# Patient Record
Sex: Female | Born: 1980 | Race: White | Hispanic: No | Marital: Married | Smoking: Never smoker
Health system: Southern US, Community
[De-identification: ages and names within clinical notes are randomized; demographics above are authoritative.]

## PROBLEM LIST (undated history)

## (undated) DIAGNOSIS — F32A Depression, unspecified: Secondary | ICD-10-CM

## (undated) DIAGNOSIS — F329 Major depressive disorder, single episode, unspecified: Secondary | ICD-10-CM

## (undated) HISTORY — PX: APPENDECTOMY: SHX54

---

## 2014-02-16 ENCOUNTER — Encounter (HOSPITAL_BASED_OUTPATIENT_CLINIC_OR_DEPARTMENT_OTHER): Payer: Self-pay

## 2014-02-16 ENCOUNTER — Emergency Department (HOSPITAL_BASED_OUTPATIENT_CLINIC_OR_DEPARTMENT_OTHER): Payer: 59

## 2014-02-16 ENCOUNTER — Emergency Department (HOSPITAL_BASED_OUTPATIENT_CLINIC_OR_DEPARTMENT_OTHER)
Admission: EM | Admit: 2014-02-16 | Discharge: 2014-02-16 | Disposition: A | Discharge status: Home / Self Care | Payer: 59 | Attending: Emergency Medicine | Admitting: Emergency Medicine

## 2014-02-16 DIAGNOSIS — R1011 Right upper quadrant pain: Secondary | ICD-10-CM | POA: Diagnosis not present

## 2014-02-16 DIAGNOSIS — R55 Syncope and collapse: Secondary | ICD-10-CM | POA: Diagnosis not present

## 2014-02-16 DIAGNOSIS — R61 Generalized hyperhidrosis: Secondary | ICD-10-CM | POA: Diagnosis not present

## 2014-02-16 DIAGNOSIS — F329 Major depressive disorder, single episode, unspecified: Secondary | ICD-10-CM | POA: Insufficient documentation

## 2014-02-16 DIAGNOSIS — R0789 Other chest pain: Secondary | ICD-10-CM | POA: Diagnosis not present

## 2014-02-16 DIAGNOSIS — R079 Chest pain, unspecified: Secondary | ICD-10-CM | POA: Diagnosis present

## 2014-02-16 DIAGNOSIS — R1013 Epigastric pain: Secondary | ICD-10-CM | POA: Insufficient documentation

## 2014-02-16 DIAGNOSIS — Z79899 Other long term (current) drug therapy: Secondary | ICD-10-CM | POA: Insufficient documentation

## 2014-02-16 HISTORY — DX: Major depressive disorder, single episode, unspecified: F32.9

## 2014-02-16 HISTORY — DX: Depression, unspecified: F32.A

## 2014-02-16 LAB — COMPREHENSIVE METABOLIC PANEL
ALT: 16 U/L (ref 0–35)
AST: 18 U/L (ref 0–37)
Albumin: 4.6 g/dL (ref 3.5–5.2)
Alkaline Phosphatase: 39 U/L (ref 39–117)
Anion gap: 5 (ref 5–15)
BUN: 18 mg/dL (ref 6–23)
CO2: 26 mmol/L (ref 19–32)
Calcium: 9 mg/dL (ref 8.4–10.5)
Chloride: 104 mEq/L (ref 96–112)
Creatinine, Ser: 0.65 mg/dL (ref 0.50–1.10)
GFR calc Af Amer: >90 mL/min (ref >90)
GLUCOSE: 104 mg/dL — AB (ref 70–99)
POTASSIUM: 3.8 mmol/L (ref 3.5–5.1)
SODIUM: 135 mmol/L (ref 135–145)
Total Bilirubin: 0.3 mg/dL (ref 0.3–1.2)
Total Protein: 7.4 g/dL (ref 6.0–8.3)

## 2014-02-16 LAB — CBC WITH DIFFERENTIAL/PLATELET
BASOS PCT: 0 % (ref 0–1)
Basophils Absolute: 0 10*3/uL (ref 0.0–0.1)
EOS PCT: 2 % (ref 0–5)
Eosinophils Absolute: 0.1 10*3/uL (ref 0.0–0.7)
HEMATOCRIT: 39.7 % (ref 36.0–46.0)
Hemoglobin: 13.1 g/dL (ref 12.0–15.0)
LYMPHS ABS: 1.6 10*3/uL (ref 0.7–4.0)
LYMPHS PCT: 34 % (ref 12–46)
MCH: 29.8 pg (ref 26.0–34.0)
MCHC: 33 g/dL (ref 30.0–36.0)
MCV: 90.4 fL (ref 78.0–100.0)
MONO ABS: 0.3 10*3/uL (ref 0.1–1.0)
Monocytes Relative: 6 % (ref 3–12)
Neutro Abs: 2.7 10*3/uL (ref 1.7–7.7)
Neutrophils Relative %: 58 % (ref 43–77)
Platelets: 264 10*3/uL (ref 150–400)
RBC: 4.39 MIL/uL (ref 3.87–5.11)
RDW: 12.7 % (ref 11.5–15.5)
WBC: 4.7 10*3/uL (ref 4.0–10.5)

## 2014-02-16 LAB — TROPONIN I

## 2014-02-16 LAB — D-DIMER, QUANTITATIVE: D-Dimer, Quant: <0.27 ug/mL-FEU (ref 0.00–0.48)

## 2014-02-16 MED ORDER — NAPROXEN 500 MG PO TABS: 500.0000 mg | ORAL_TABLET | Freq: Two times a day (BID) | ORAL | Status: AC

## 2014-02-16 MED ORDER — KETOROLAC TROMETHAMINE 15 MG/ML IJ SOLN
15.0000 mg | Freq: Once | INTRAMUSCULAR | Status: AC
  Administered 2014-02-16: 15 mg via INTRAVENOUS
  Filled 2014-02-16: qty 1

## 2014-02-16 MED ORDER — ONDANSETRON HCL 4 MG/2ML IJ SOLN
4.0000 mg | Freq: Once | INTRAMUSCULAR | Status: AC
  Administered 2014-02-16: 4 mg via INTRAVENOUS
  Filled 2014-02-16: qty 2

## 2014-02-16 MED ORDER — FENTANYL CITRATE 0.05 MG/ML IJ SOLN
50.0000 ug | Freq: Once | INTRAMUSCULAR | Status: AC
  Administered 2014-02-16: 50 ug via INTRAVENOUS
  Filled 2014-02-16: qty 2

## 2014-02-16 MED ORDER — IOHEXOL 350 MG/ML SOLN
100.0000 mL | Freq: Once | INTRAVENOUS | Status: AC | PRN
  Administered 2014-02-16: 100 mL via INTRAVENOUS

## 2014-02-16 NOTE — ED Provider Notes (Addendum)
CSN: [id number]     Arrival date & time 02/16/14  0700 History   First MD Initiated Contact with Patient 02/16/14 0725     Chief Complaint  Patient presents with  . Chest Pain     (Consider location/radiation/quality/duration/timing/severity/associated sxs/prior Treatment) Patient is a 34 y.o. female presenting with chest pain. The history is provided by the patient and the spouse.  Chest Pain Pain location:  L chest Pain quality: sharp and stabbing   Pain radiates to:  L shoulder and L arm Pain radiates to the back: yes   Pain severity:  Severe Onset quality:  Sudden Duration:  1 hour Timing:  Constant Progression:  Unchanged Chronicity:  New Context comment:  States she woke up this morning to her alarm and was fine and then when she went to get out of bed she developed severe pain in her left chest and shoulder area Relieved by:  Nothing Worsened by:  Movement (Movement of the arm and attempts to getting up) Ineffective treatments:  Rest Associated symptoms: diaphoresis and near-syncope   Associated symptoms: no cough, no dizziness, no fever, no headache, no lower extremity edema, no nausea, no numbness, no palpitations, no shortness of breath, not vomiting and no weakness   Risk factors: immobilization   Risk factors: no birth control, no coronary artery disease, no diabetes mellitus, no high cholesterol, no hypertension, no Marfan's syndrome, not obese, not pregnant, no prior DVT/PE, no smoking and no surgery   Risk factors comment:  Patient recently had a long car trip from [place] to [facility] Thursday and Sunday   Past Medical History  Diagnosis Date  . Depression    Past Surgical History  Procedure Laterality Date  . Appendectomy     No family history on file. History  Substance Use Topics  . Smoking status: Never Smoker   . Smokeless tobacco: Not on file  . Alcohol Use: Yes     Comment: occasional   OB History    No data available     Review of  Systems  Constitutional: Positive for diaphoresis. Negative for fever.  Respiratory: Negative for cough and shortness of breath.   Cardiovascular: Positive for chest pain and near-syncope. Negative for palpitations.  Gastrointestinal: Negative for nausea and vomiting.  Neurological: Negative for dizziness, weakness, numbness and headaches.  All other systems reviewed and are negative.     Allergies  Review of patient's allergies indicates no known allergies.  Home Medications   Prior to Admission medications   Medication Sig Start Date End Date Taking? Authorizing Provider  FLUoxetine (PROZAC) 20 MG capsule Take 20 mg by mouth daily.   Yes Historical Provider, MD   BP 128/80 mmHg  Pulse 78  Temp(Src) 98.4 F (36.9 C) (Oral)  Resp 16  Ht  (1.651 m)  Wt 104 lb (47.174 kg)  BMI 17.31 kg/m2  SpO2 100%  LMP 02/16/2014 Physical Exam  Constitutional: She is oriented to person, place, and time. She appears well-developed and well-nourished.  Thin female lying still in bed with mild diaphoresis  HENT:  Head: Normocephalic and atraumatic.  Mouth/Throat: Oropharynx is clear and moist.  Eyes: Conjunctivae and EOM are normal. Pupils are equal, round, and reactive to light.  Neck: Normal range of motion. Neck supple.  Cardiovascular: Normal rate, regular rhythm and intact distal pulses.   No murmur heard. To palpation left radial pulse decreased compared to right  Pulmonary/Chest: Effort normal and breath sounds normal. No respiratory distress. She has no wheezes.  She has no rales. She exhibits no tenderness.  Abdominal: Soft. She exhibits no distension. There is tenderness in the right upper quadrant and epigastric area. There is no rebound and no guarding.    Mild epigastric and right upper quadrant tenderness  Musculoskeletal: Normal range of motion. She exhibits no edema or tenderness.  Neurological: She is alert and oriented to person, place, and time.  Skin: Skin is warm.  No rash noted. She is diaphoretic. No erythema. There is pallor.  Psychiatric: She has a normal mood and affect. Her behavior is normal.  Nursing note and vitals reviewed.   ED Course  Procedures (including critical care time) Labs Review Labs Reviewed  COMPREHENSIVE METABOLIC PANEL - Abnormal; Notable for the following:    Glucose, Bld 104 (*)    All other components within normal limits  CBC WITH DIFFERENTIAL  TROPONIN I  D-DIMER, QUANTITATIVE    Imaging Review Dg Chest 2 View  02/16/2014   CLINICAL DATA:  Left shoulder pain  EXAM: CHEST  2 VIEW  COMPARISON:  None.  FINDINGS: Lungs are clear.  No pleural effusion or pneumothorax.  The heart is normal in size.  Visualized osseous structures are within normal limits.  IMPRESSION: Normal chest radiographs.   Electronically Signed   By: Charline Bills M.D.   On: 02/16/2014 07:51   Ct Angio Chest Aorta W/cm &/or Wo/cm  02/16/2014   CLINICAL DATA:  Left shoulder pain radiating into the chest. Dizziness and lightheadedness. Question aortic dissection. Initial encounter.  EXAM: CT ANGIOGRAPHY CHEST, ABDOMEN AND PELVIS  TECHNIQUE: Multidetector CT imaging through the chest, abdomen and pelvis was performed using the standard protocol during bolus administration of intravenous contrast. Multiplanar reconstructed images and MIPs were obtained and reviewed to evaluate the vascular anatomy.  CONTRAST:  100 mL OMNIPAQUE IOHEXOL 350 MG/ML SOLN  COMPARISON:  None.  FINDINGS: CTA CHEST FINDINGS  There is no aortic dissection or aneurysm. Heart size is normal. Small calcified lymph nodes are seen in the hila. No pathologically enlarged lymph nodes are identified in the axilla, mediastinum or hila. There is no pleural or pericardial effusion. Heart size is normal. Calcified granulomata are seen in the right lower lobe. The lungs are otherwise clear. There is no focal bony abnormality.  Review of the MIP images confirms the above findings.  CTA ABDOMEN AND  PELVIS FINDINGS  There is no aortic dissection or aneurysm. No atherosclerotic vascular disease is identified. All major branch vessels of the aorta are widely patent.  The liver, spleen, adrenal glands, pancreas and kidneys all appear normal. Uterus, adnexa and urinary bladder are unremarkable. The stomach and small and large bowel appear normal. The patient is status post appendectomy. There is no lymphadenopathy or fluid. No bony abnormality is identified.  Review of the MIP images confirms the above findings.  IMPRESSION: Negative for aortic dissection in the chest, abdomen or pelvis. No acute abnormality or finding to explain the patient's symptoms is identified.  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   Electronically Signed   By: Drusilla Kanner M.D.   On: 02/16/2014 08:44   Ct Cta Abd/pel W/cm &/or W/o Cm  02/16/2014   CLINICAL DATA:  Left shoulder pain radiating into the chest. Dizziness and lightheadedness. Question aortic dissection. Initial encounter.  EXAM: CT ANGIOGRAPHY CHEST, ABDOMEN AND PELVIS  TECHNIQUE: Multidetector CT imaging through the chest, abdomen and pelvis was performed using the standard protocol during bolus administration of intravenous contrast. Multiplanar reconstructed images  and MIPs were obtained and reviewed to evaluate the vascular anatomy.  CONTRAST:  100 mL OMNIPAQUE IOHEXOL 350 MG/ML SOLN  COMPARISON:  None.  FINDINGS: CTA CHEST FINDINGS  There is no aortic dissection or aneurysm. Heart size is normal. Small calcified lymph nodes are seen in the hila. No pathologically enlarged lymph nodes are identified in the axilla, mediastinum or hila. There is no pleural or pericardial effusion. Heart size is normal. Calcified granulomata are seen in the right lower lobe. The lungs are otherwise clear. There is no focal bony abnormality.  Review of the MIP images confirms the above findings.  CTA ABDOMEN AND PELVIS FINDINGS  There is no aortic dissection or  aneurysm. No atherosclerotic vascular disease is identified. All major branch vessels of the aorta are widely patent.  The liver, spleen, adrenal glands, pancreas and kidneys all appear normal. Uterus, adnexa and urinary bladder are unremarkable. The stomach and small and large bowel appear normal. The patient is status post appendectomy. There is no lymphadenopathy or fluid. No bony abnormality is identified.  Review of the MIP images confirms the above findings.  IMPRESSION: Negative for aortic dissection in the chest, abdomen or pelvis. No acute abnormality or finding to explain the patient's symptoms is identified.  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   Electronically Signed   By: Drusilla Kannerhomas  Dalessio M.D.   On: 02/16/2014 08:44     EKG Interpretation   Date/Time:  Tuesday February 16 2014 07:05:24 EST Ventricular Rate:  70 PR Interval:  148 QRS Duration: 72 QT Interval:  438 QTC Calculation: 473 R Axis:   69 Text Interpretation:  Normal sinus rhythm Normal ECG No previous tracing  Confirmed by Anitra LauthPLUNKETT  MD, Alphonzo LemmingsWHITNEY ([id number]) on 02/16/2014 7:14:52 AM      MDM   Final diagnoses:  Chest pain  Atypical chest pain    Patient presenting with acute onset of sharp left chest wall pain radiating into the scapula and down her left arm with associated near syncope.  Patient is otherwise healthy and has never experienced symptoms like this before. No family history of sudden cardiac death, Marfan's, aortic dissection but grandfather with an MI in his 3060s. Patient denies any drug or tobacco use. She denies shortness of breath. She appears uncomfortable on exam however pain cannot be reproduced by palpation of the chest wall. However she states it is worse with moving the arm. She recently did have a long car ride approximately 2 days ago but denies unilateral leg pain or swelling. She takes no exogenous hormones.  Concern for PE versus aortic dissection versus spontaneous  pneumothorax versus possible GI etiology.  Minimal epigastric tenderness on exam, equal breath sounds bilaterally, weaker palpated pulse on the left. Patient is currently neurovascularly intact. She was given pain control vital signs are normal.  EKG is within normal limits.  CBC, CMP, troponin, d-dimer, chest x-ray pending. Feel that patient will most likely need a CT chest and abdomen rule out dissection  8:22 AM Initial labs wnl.  CXR wnl.  Will get CT to r/o dissection.  Pt feeling better after pain meds  9:21 AM CT angio neg.  Pt was given toradol and able to ambulate here without difficulty or syncope.  Will d/c home with pain control and PCP f/u.  Gwyneth SproutWhitney Yasira Engelson, MD 02/16/14 [id number]  Gwyneth SproutWhitney Brittlyn Cloe, MD 02/16/14 [id number]

## 2014-02-16 NOTE — ED Notes (Signed)
Pt rpeorts she developed acute left chest wall pain radiating to scapula and left arm described as sharp in nature and continuous since 0640am.  States she felt "lightheaded when attempting OOB and nausea.  Denies previous hx of same.  [place] [facility] [place] And [place].

## 2014-02-16 NOTE — Discharge Instructions (Signed)

## 2014-02-16 NOTE — ED Notes (Signed)
Patient ambulates around nurse's station without difficulty, patient states she feels much better and is ready to go home.

## 2014-02-16 NOTE — ED Notes (Signed)
MD at bedside. 

## 2014-02-16 NOTE — ED Notes (Signed)
Returned from CT.

## 2016-01-16 IMAGING — CR DG CHEST 2V
2 series · 2 of 2 positions shown · non-contrast
Comparison: None.

CLINICAL DATA: Left shoulder pain

EXAM:
CHEST  2 VIEW

[w chest pa]
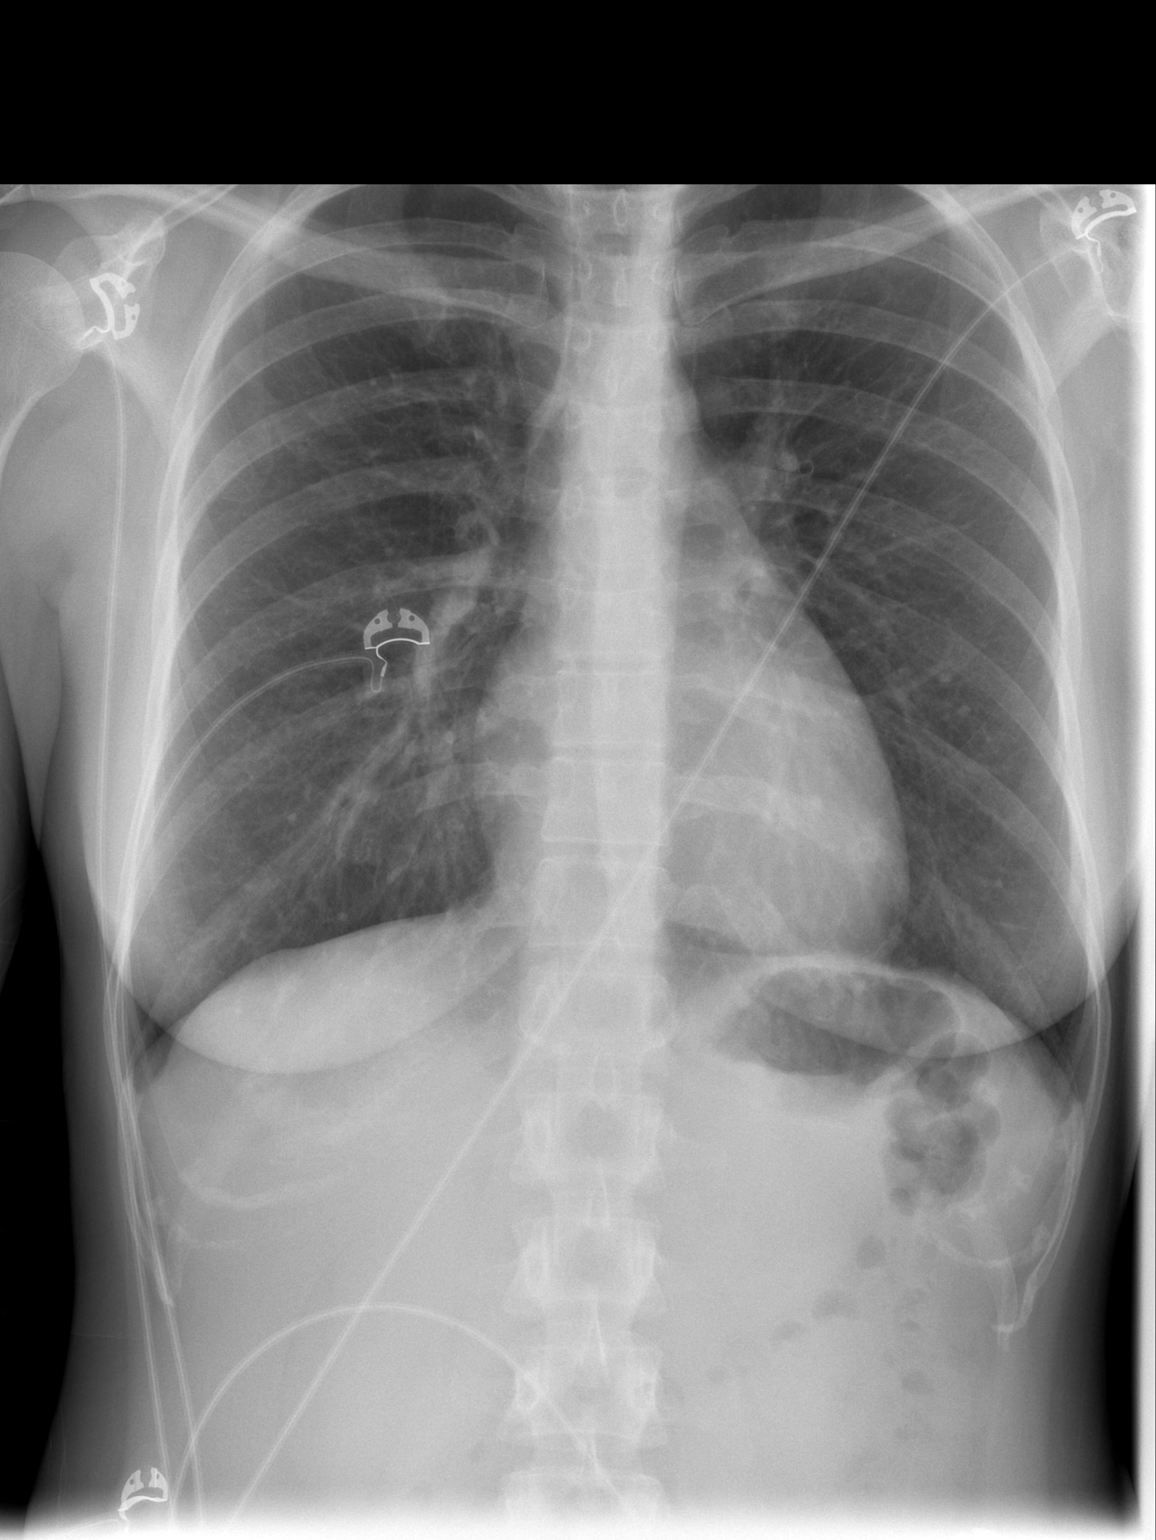

[w chest lat]
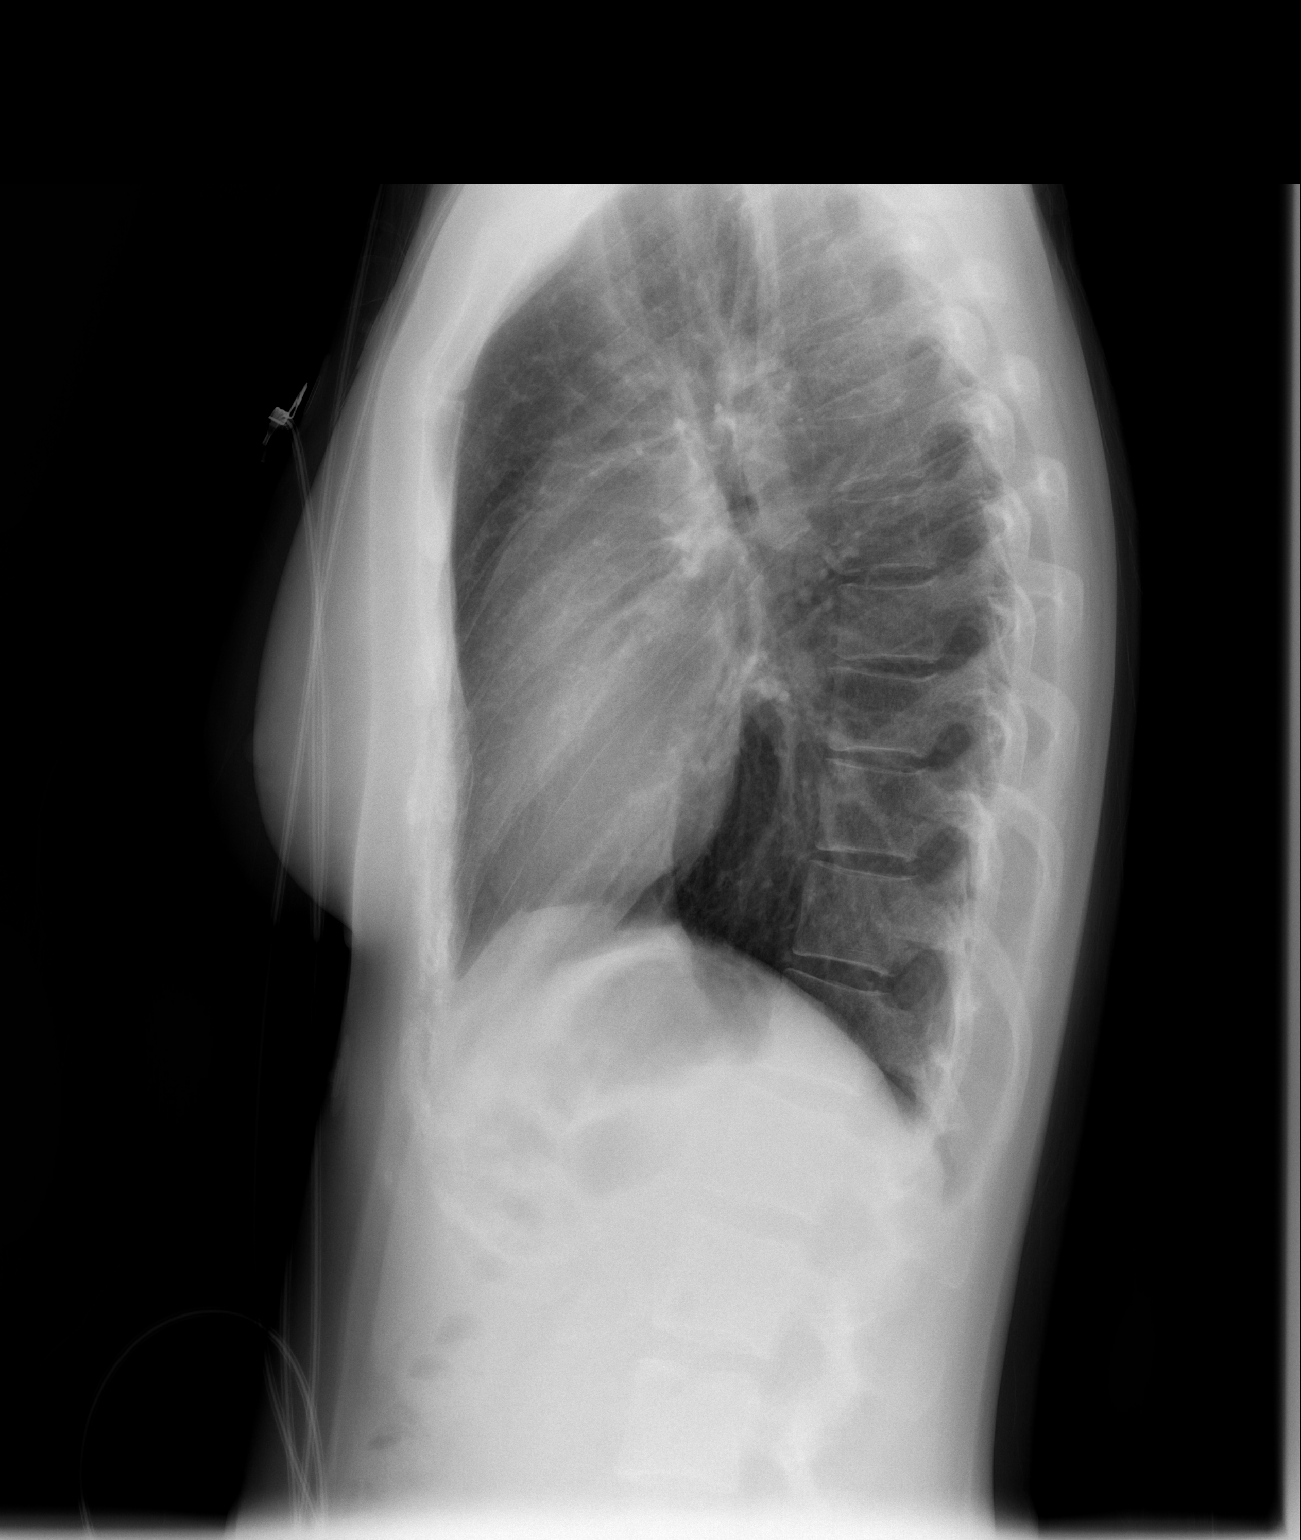

[2 of 2 positions shown; findings below may reference images not displayed]

FINDINGS: Lungs are clear.  No pleural effusion or pneumothorax.

The heart is normal in size.

Visualized osseous structures are within normal limits.
IMPRESSION: Normal chest radiographs.

## 2016-01-16 IMAGING — CT CT ANGIO CHEST
2 of 7 series · 16 of 46 positions shown · IV contrast (omnipaque)
Comparison: None.

CLINICAL DATA: Left shoulder pain radiating into the chest.
Dizziness and lightheadedness. Question aortic dissection. Initial
encounter.

EXAM:
CT ANGIOGRAPHY CHEST, ABDOMEN AND PELVIS
TECHNIQUE: Multidetector CT imaging through the chest, abdomen and pelvis was
performed using the standard protocol during bolus administration of
intravenous contrast. Multiplanar reconstructed images and MIPs were
obtained and reviewed to evaluate the vascular anatomy.
CONTRAST:  100 mL OMNIPAQUE IOHEXOL 350 MG/ML SOLN

[Series 5: dissection 2.0 b26f · axial · 0.63mm/px · z∈[-609,-65]mm · 13 of 306 slices shown]
[im 17/306  lung]
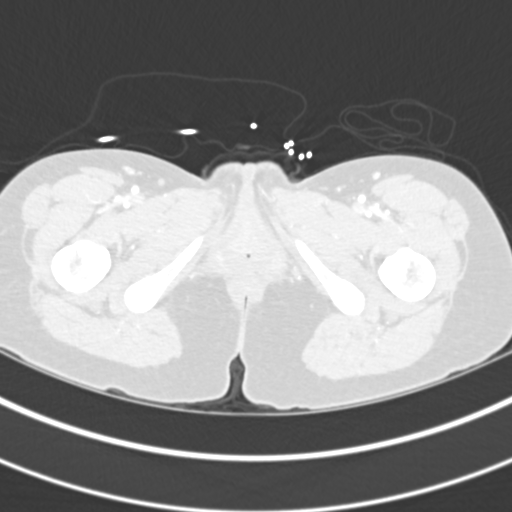
[im 49/306  soft-tissue]
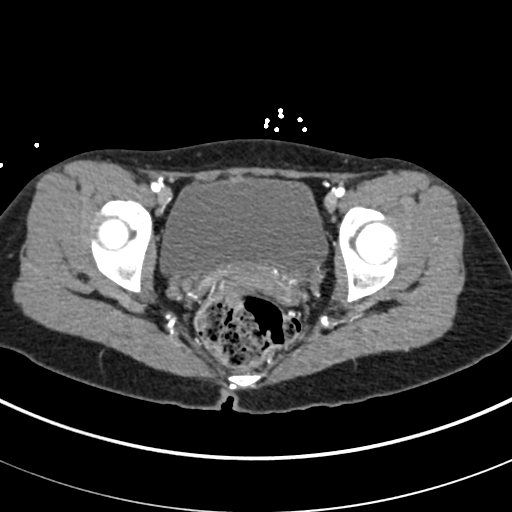
[im 65/306  lung]
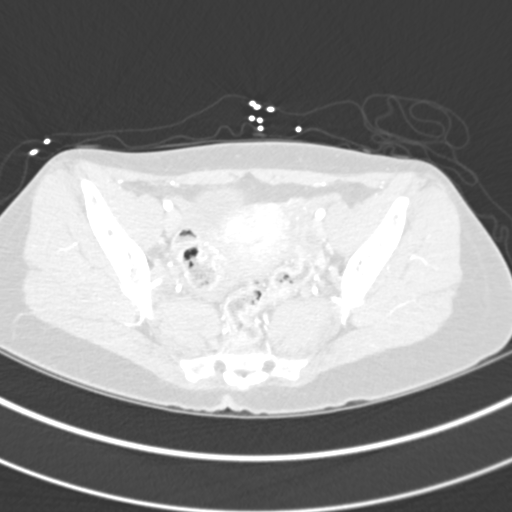
[im 81/306  soft-tissue]
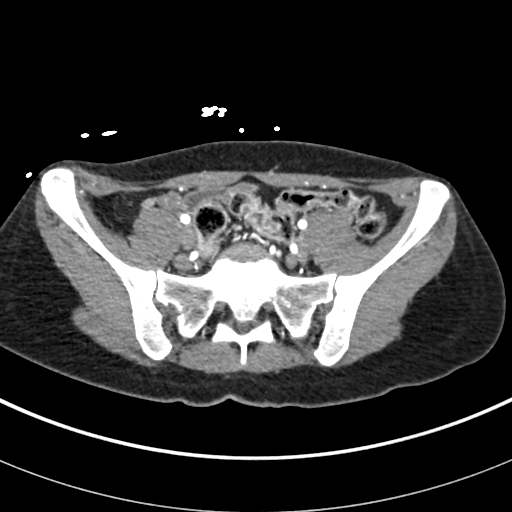
[im 113/306  lung]
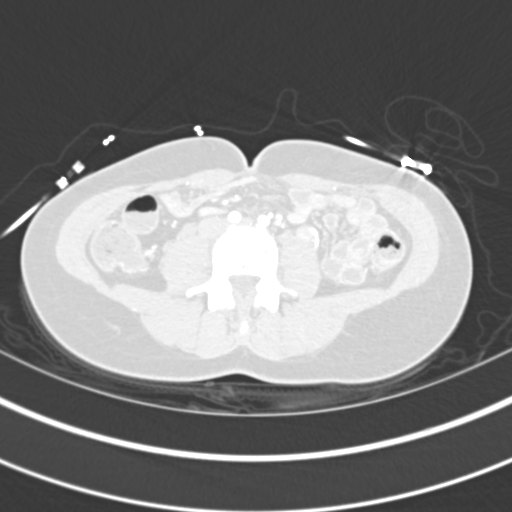
[im 129/306  soft-tissue]
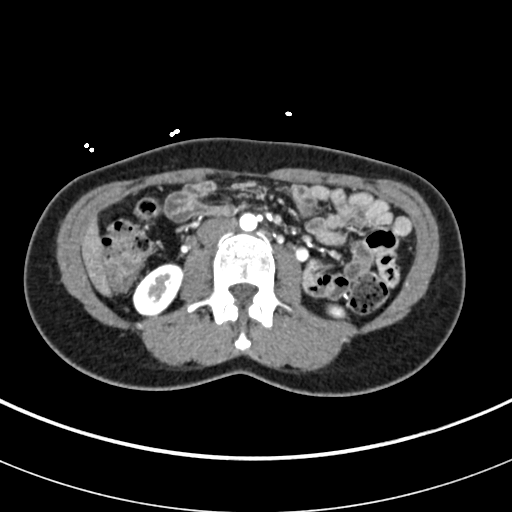
[im 161/306  lung]
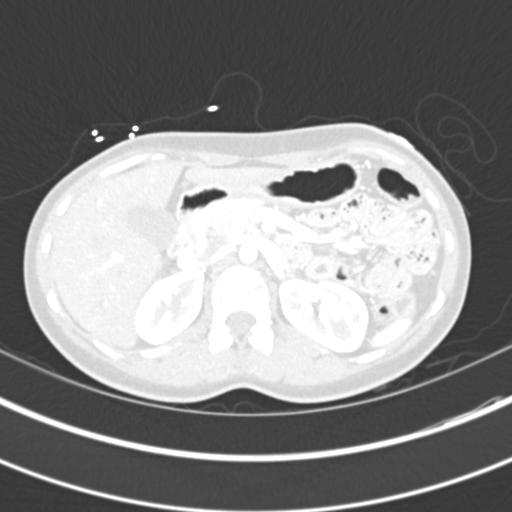
[im 177/306  soft-tissue]
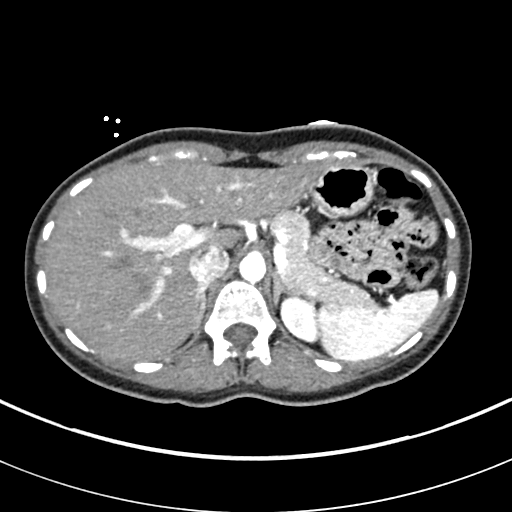
[im 193/306  lung]
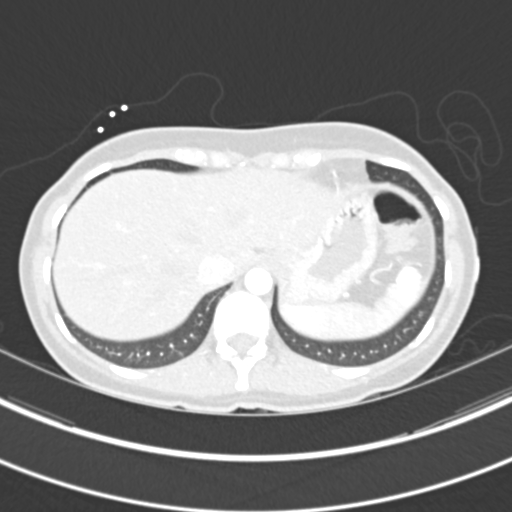
[im 225/306  soft-tissue]
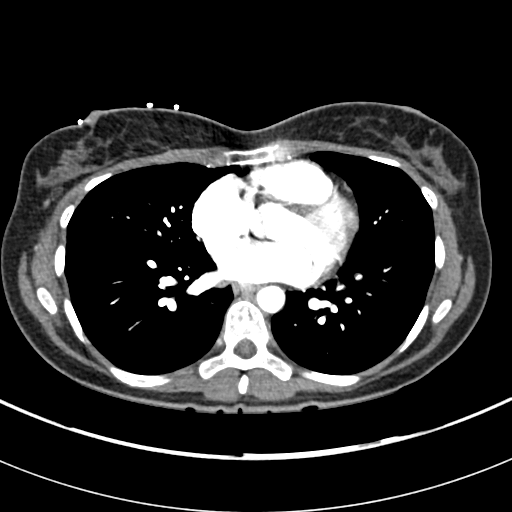
[im 241/306  lung]
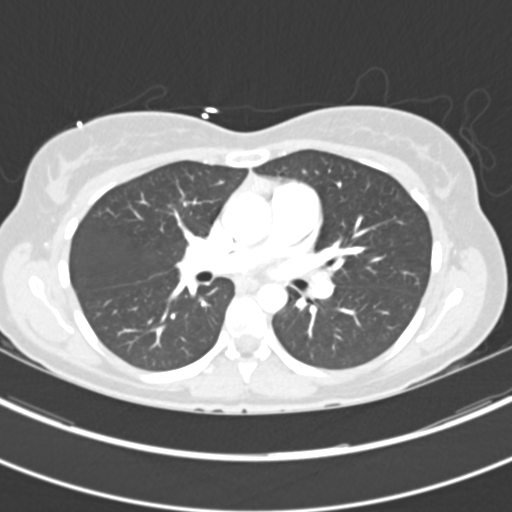
[im 257/306  soft-tissue]
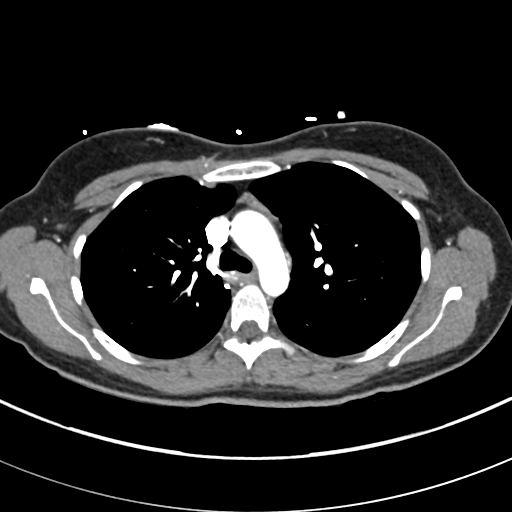
[im 289/306  lung]
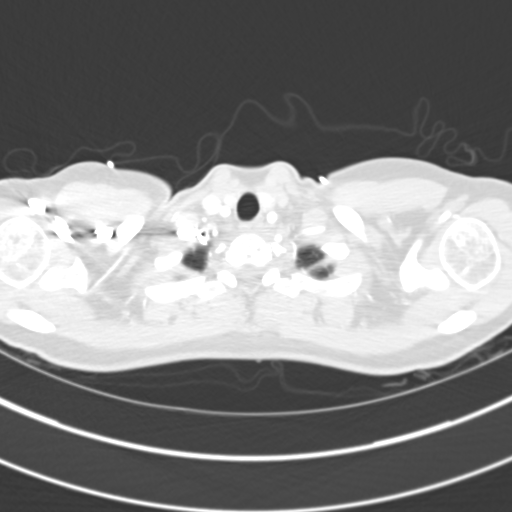

[Series 7: dissection 2.0 coronal · coronal · 0.68mm/px · 3 of 89 slices shown]
[im 23/89  soft-tissue]
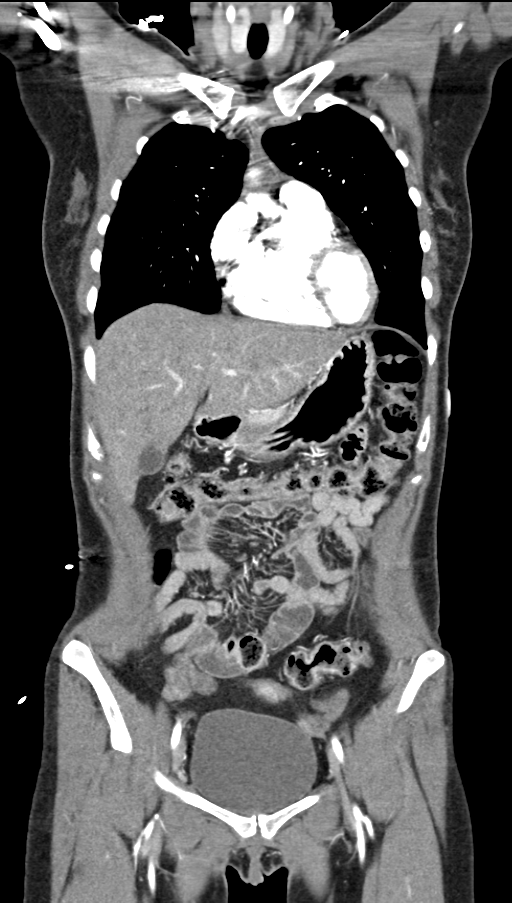
[im 45/89  soft-tissue]
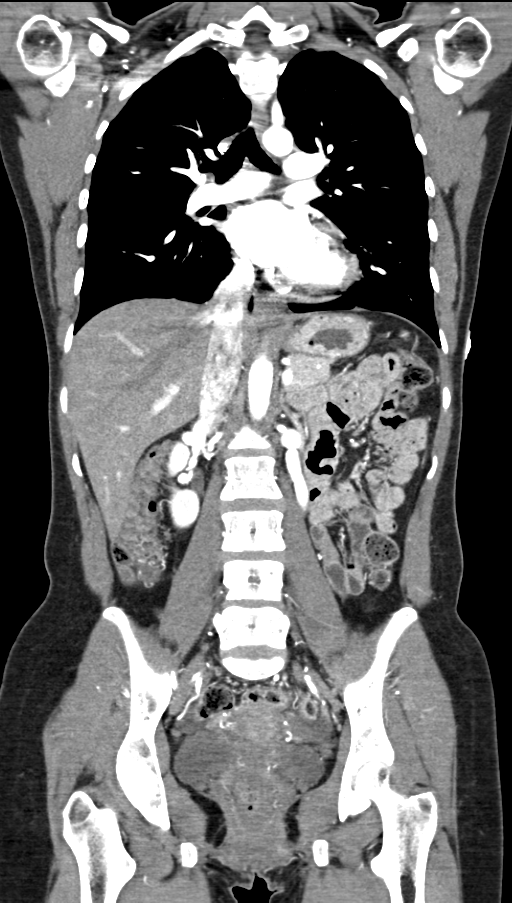
[im 67/89  soft-tissue]
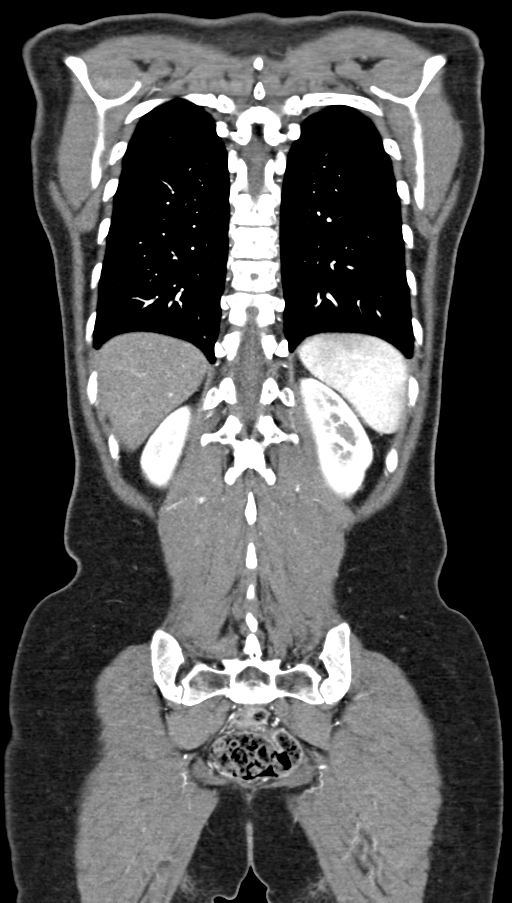

[16 of 46 positions shown; findings below may reference images not displayed]

FINDINGS: CTA CHEST FINDINGS

There is no aortic dissection or aneurysm. Heart size is normal.
Small calcified lymph nodes are seen in the hila. No pathologically
enlarged lymph nodes are identified in the axilla, mediastinum or
hila. There is no pleural or pericardial effusion. Heart size is
normal. Calcified granulomata are seen in the right lower lobe. The
lungs are otherwise clear. There is no focal bony abnormality.

Review of the MIP images confirms the above findings.

CTA ABDOMEN AND PELVIS FINDINGS

There is no aortic dissection or aneurysm. No atherosclerotic
vascular disease is identified. All major branch vessels of the
aorta are widely patent.

The liver, spleen, adrenal glands, pancreas and kidneys all appear
normal. Uterus, adnexa and urinary bladder are unremarkable. The
stomach and small and large bowel appear normal. The patient is
status post appendectomy. There is no lymphadenopathy or fluid. No
bony abnormality is identified.

Review of the MIP images confirms the above findings.
IMPRESSION: Negative for aortic dissection in the chest, abdomen or pelvis. No
acute abnormality or finding to explain the patient's symptoms is
identified.

Findings consistent with old granulomatous disease in the chest.
